# Patient Record
Sex: Male | Born: 1945 | Race: White | Hispanic: No
Health system: Southern US, Community
[De-identification: ages and names within clinical notes are randomized; demographics above are authoritative.]

---

## 2017-06-10 ENCOUNTER — Inpatient Hospital Stay
Admission: RE | Admit: 2017-06-10 | Discharge: 2017-06-28 | Disposition: A | Discharge status: Skilled Nursing Facility | Payer: Self-pay | Source: Other Acute Inpatient Hospital | Attending: Internal Medicine | Admitting: Internal Medicine

## 2017-06-10 DIAGNOSIS — Z992 Dependence on renal dialysis: Secondary | ICD-10-CM

## 2017-06-10 LAB — COMPREHENSIVE METABOLIC PANEL
ALBUMIN: 2.1 g/dL — AB (ref 3.5–5.0)
ALK PHOS: 1354 U/L — AB (ref 38–126)
ALT: 183 U/L — ABNORMAL HIGH (ref 17–63)
ANION GAP: 11 (ref 5–15)
AST: 273 U/L — ABNORMAL HIGH (ref 15–41)
BUN: 29 mg/dL — ABNORMAL HIGH (ref 6–20)
CALCIUM: 8.7 mg/dL — AB (ref 8.9–10.3)
CHLORIDE: 105 mmol/L (ref 101–111)
CO2: 24 mmol/L (ref 22–32)
Creatinine, Ser: 2.36 mg/dL — ABNORMAL HIGH (ref 0.61–1.24)
GFR calc non Af Amer: 26 mL/min — ABNORMAL LOW (ref >60)
GFR, EST AFRICAN AMERICAN: 30 mL/min — AB (ref >60)
GLUCOSE: 117 mg/dL — AB (ref 65–99)
POTASSIUM: 4 mmol/L (ref 3.5–5.1)
SODIUM: 140 mmol/L (ref 135–145)
Total Bilirubin: 24.1 mg/dL (ref 0.3–1.2)
Total Protein: 5.4 g/dL — ABNORMAL LOW (ref 6.5–8.1)

## 2017-06-10 LAB — CBC
HCT: 31 % — ABNORMAL LOW (ref 39.0–52.0)
Hemoglobin: 10.5 g/dL — ABNORMAL LOW (ref 13.0–17.0)
MCH: 28.5 pg (ref 26.0–34.0)
MCHC: 33.9 g/dL (ref 30.0–36.0)
MCV: 84 fL (ref 78.0–100.0)
PLATELETS: 278 10*3/uL (ref 150–400)
RBC: 3.69 MIL/uL — AB (ref 4.22–5.81)
RDW: 16.4 % — AB (ref 11.5–15.5)
WBC: 12.4 10*3/uL — ABNORMAL HIGH (ref 4.0–10.5)

## 2017-06-10 LAB — PROTIME-INR
INR: 1.4
Prothrombin Time: 17.1 seconds — ABNORMAL HIGH (ref 11.4–15.2)

## 2017-06-10 NOTE — Consult Note (Signed)
[facility] CONSULT NOTE    Date: 06/10/2017                  Patient Name:  Dakota Case  MRN: [id number]  DOB: 12/06/1945  Age / Sex: 71 y.o., male         PCP: Patient, No Pcp Per                 Service Requesting Consult: Hospitalist                 Reason for Consult: Evaluation and management of ESRD            History of Present Illness: Patient is a 71 y.o. male with a PMHx of obstructive sleep apnea, migraine headaches, coronary artery disease status post four-vessel CABG, hypertension, peripheral neuropathy, BPH, diabetes mellitus type 2, colon cancer, who was admitted to [facility] from 06/01/2017 to 06/10/2017. Patient was previously on peritoneal dialysis however during his recent hospitalization was transitioned to hemodialysis. He had a recent complex hospitalization that involved a non-ST elevation myocardial infarction, acute respiratory failure with hypoxia and progressive debility. Patient also had an acute right MCA CVA. Patient a bit confused now and unable to offeraccurate history. He last had hemodialysis yesterday. He appears to have a left IJ PermCath in place at the moment. His peritoneal dialysis catheter remains in place as well.  Medications: Humalg insulin, albuterol 3 ML's inhaled every 6 hours, amlodipine 5 mg daily, aspirin 81 mg daily, Lipitor 40 mg daily at bedtime, ciprofloxacin 500 mg daily, Plavix 75 mg daily, Pepcid 20 mg daily, folic acid 1 mg daily, heparin 5000 units subcutaneous every 8 hours, Imdur 30 mg by mouth every morning, metoprolol 25 mg twice a day, protein supplement, thiamine 100 mg daily, vitamin B12 1000 g daily   Allergies: No known drug allergies    Past Medical History: obstructive sleep apnea, migraine headaches, coronary artery disease status post four-vessel CABG, hypertension, peripheral neuropathy, BPH, diabetes mellitus type 2, colon cancer ESRD status post peritoneal dialysis catheter  placement 05/27/2017, history of partial colectomy  Past Surgical History: CABG PD catheter placement Partial colectomy Cholecystectomy ORIF humerus fracture  Family History: No family history of end-stage renal disease  Social History: No tobacco, alcohol, or illicit drug use.  Review of Systems: Patient unable to concentrate on ROS questions likely due to recent CVA  Vital Signs: Temperature 99.5 pulse 76 respirations 20 blood pressure 140/75  Physical Exam: General: NAD, laying in bed  Head: Normocephalic, atraumatic.  Eyes: Anicteric, EOMI  Nose: Mucous membranes moist, not inflammed, nonerythematous.  Throat: Oropharynx nonerythematous, no exudate appreciated.   Neck: Supple, trachea midline.  Lungs:  Normal respiratory effort. Clear to auscultation BL without crackles or wheezes.  Heart: RRR. S1 and S2 normal without gallop, murmur, or rubs.  Abdomen:  BS normoactive. Soft, Nondistended, non-tender.  No masses or organomegaly.  Extremities: trace pretibial edema.  Neurologic: Awake, alert, confused at times, will follow simple commands  Skin: No visible rashes, scars.    Lab results: Basic Metabolic Panel:  Recent Labs Lab 06/10/17 1350  NA 140  K 4.0  CL 105  CO2 24  GLUCOSE 117*  BUN 29*  CREATININE 2.36*  CALCIUM 8.7*    Liver Function Tests:  Recent Labs Lab 06/10/17 1350  AST 273*  ALT 183*  ALKPHOS 1,354*  BILITOT 24.1*  PROT 5.4*  ALBUMIN 2.1*   No results for input(s): LIPASE, AMYLASE in  the last 168 hours. No results for input(s): AMMONIA in the last 168 hours.  CBC:  Recent Labs Lab 06/10/17 1350  WBC 12.4*  HGB 10.5*  HCT 31.0*  MCV 84.0  PLT 278    Cardiac Enzymes: No results for input(s): CKTOTAL, CKMB, CKMBINDEX, TROPONINI in the last 168 hours.  BNP: Invalid input(s): POCBNP  CBG: No results for input(s): GLUCAP in the last 168 hours.  Microbiology: No results found for this or any previous  visit.  Coagulation Studies:  Recent Labs  06/10/17 1355  LABPROT 17.1*  INR 1.40    Urinalysis: No results for input(s): COLORURINE, LABSPEC, PHURINE, GLUCOSEU, HGBUR, BILIRUBINUR, KETONESUR, PROTEINUR, UROBILINOGEN, NITRITE, LEUKOCYTESUR in the last 72 hours.  Invalid input(s): APPERANCEUR    Imaging:  No results found.   Assessment & Plan: Pt is a 71 y.o. male with a PMHx of obstructive sleep apnea, migraine headaches, coronary artery disease status post four-vessel CABG, hypertension, peripheral neuropathy, BPH, diabetes mellitus type 2, colon cancer, ESRD on HD (previously on PD), anemia of CKD, SHPTH.    1. ESRD on HD. Patient was transitioned from peritoneal dialysis to hemodialysis recently. We will plan for hemodialysis again tomorrow. We will use his existing PermCath.  2. Anemia of chronic kidney disease. Hemoglobin currently 10.5. Hold off on Aranesp for now.  3. Secondary hyperparathyroidism. We will evaluate bone mineral metabolism parameters by checking PTH, phosphorus, and calcium tomorrow.  4. Hypertension. We will maintain the patient on metoprolol, amlodipine for now.

## 2017-06-11 ENCOUNTER — Other Ambulatory Visit (HOSPITAL_COMMUNITY): Payer: Self-pay

## 2017-06-11 LAB — RENAL FUNCTION PANEL
ANION GAP: 12 (ref 5–15)
Albumin: 2.1 g/dL — ABNORMAL LOW (ref 3.5–5.0)
BUN: 67 mg/dL — ABNORMAL HIGH (ref 6–20)
CALCIUM: 8.2 mg/dL — AB (ref 8.9–10.3)
CO2: 24 mmol/L (ref 22–32)
Chloride: 102 mmol/L (ref 101–111)
Creatinine, Ser: 6.28 mg/dL — ABNORMAL HIGH (ref 0.61–1.24)
GFR calc non Af Amer: 8 mL/min — ABNORMAL LOW (ref >60)
GFR, EST AFRICAN AMERICAN: 9 mL/min — AB (ref >60)
GLUCOSE: 85 mg/dL (ref 65–99)
POTASSIUM: 4.1 mmol/L (ref 3.5–5.1)
Phosphorus: 5.7 mg/dL — ABNORMAL HIGH (ref 2.5–4.6)
SODIUM: 138 mmol/L (ref 135–145)

## 2017-06-11 LAB — CK TOTAL AND CKMB (NOT AT ARMC)
CK, MB: 2.3 ng/mL (ref 0.5–5.0)
RELATIVE INDEX: INVALID (ref 0.0–2.5)
Total CK: 67 U/L (ref 49–397)

## 2017-06-11 LAB — BLOOD GAS, ARTERIAL
Acid-Base Excess: 0.6 mmol/L (ref 0.0–2.0)
Bicarbonate: 23.6 mmol/L (ref 20.0–28.0)
FIO2: 0.21
O2 SAT: 93.1 %
PATIENT TEMPERATURE: 98.6
PCO2 ART: 31 mmHg — AB (ref 32.0–48.0)
PO2 ART: 66.6 mmHg — AB (ref 83.0–108.0)
pH, Arterial: 7.495 — ABNORMAL HIGH (ref 7.350–7.450)

## 2017-06-11 LAB — COMPREHENSIVE METABOLIC PANEL
ALBUMIN: 2.2 g/dL — AB (ref 3.5–5.0)
ALT: 12 U/L — ABNORMAL LOW (ref 17–63)
ANION GAP: 13 (ref 5–15)
AST: 31 U/L (ref 15–41)
Alkaline Phosphatase: 75 U/L (ref 38–126)
BILIRUBIN TOTAL: 1.1 mg/dL (ref 0.3–1.2)
BUN: 32 mg/dL — AB (ref 6–20)
CO2: 21 mmol/L — AB (ref 22–32)
Calcium: 7.8 mg/dL — ABNORMAL LOW (ref 8.9–10.3)
Chloride: 98 mmol/L — ABNORMAL LOW (ref 101–111)
Creatinine, Ser: 3.72 mg/dL — ABNORMAL HIGH (ref 0.61–1.24)
GFR calc Af Amer: 18 mL/min — ABNORMAL LOW (ref >60)
GFR calc non Af Amer: 15 mL/min — ABNORMAL LOW (ref >60)
GLUCOSE: 105 mg/dL — AB (ref 65–99)
POTASSIUM: 3.5 mmol/L (ref 3.5–5.1)
SODIUM: 132 mmol/L — AB (ref 135–145)
Total Protein: 7 g/dL (ref 6.5–8.1)

## 2017-06-11 LAB — CBC
HCT: 36.1 % — ABNORMAL LOW (ref 39.0–52.0)
Hemoglobin: 11.4 g/dL — ABNORMAL LOW (ref 13.0–17.0)
MCH: 29.3 pg (ref 26.0–34.0)
MCHC: 31.6 g/dL (ref 30.0–36.0)
MCV: 92.8 fL (ref 78.0–100.0)
Platelets: 203 10*3/uL (ref 150–400)
RBC: 3.89 MIL/uL — AB (ref 4.22–5.81)
RDW: 13.2 % (ref 11.5–15.5)
WBC: 12.5 10*3/uL — AB (ref 4.0–10.5)

## 2017-06-11 LAB — TROPONIN I
TROPONIN I: 0.09 ng/mL — AB (ref <0.03)
TROPONIN I: 0.12 ng/mL — AB (ref <0.03)

## 2017-06-12 LAB — CBC WITH DIFFERENTIAL/PLATELET
Basophils Absolute: 0 10*3/uL (ref 0.0–0.1)
Basophils Relative: 0 %
EOS ABS: 0.4 10*3/uL (ref 0.0–0.7)
Eosinophils Relative: 4 %
HCT: 31.2 % — ABNORMAL LOW (ref 39.0–52.0)
HEMOGLOBIN: 9.5 g/dL — AB (ref 13.0–17.0)
LYMPHS ABS: 1.5 10*3/uL (ref 0.7–4.0)
Lymphocytes Relative: 15 %
MCH: 28.6 pg (ref 26.0–34.0)
MCHC: 30.4 g/dL (ref 30.0–36.0)
MCV: 94 fL (ref 78.0–100.0)
Monocytes Absolute: 0.6 10*3/uL (ref 0.1–1.0)
Monocytes Relative: 7 %
NEUTROS PCT: 74 %
Neutro Abs: 7.2 10*3/uL (ref 1.7–7.7)
Platelets: 176 10*3/uL (ref 150–400)
RBC: 3.32 MIL/uL — AB (ref 4.22–5.81)
RDW: 13.3 % (ref 11.5–15.5)
WBC: 9.8 10*3/uL (ref 4.0–10.5)

## 2017-06-12 LAB — HEPARIN LEVEL (UNFRACTIONATED): HEPARIN UNFRACTIONATED: 0.57 [IU]/mL (ref 0.30–0.70)

## 2017-06-12 LAB — PROTIME-INR
INR: 1.3
Prothrombin Time: 16.1 seconds — ABNORMAL HIGH (ref 11.4–15.2)

## 2017-06-12 LAB — APTT
APTT: 96 s — AB (ref 24–36)
aPTT: 33 seconds (ref 24–36)

## 2017-06-12 LAB — TROPONIN I: TROPONIN I: 0.07 ng/mL — AB (ref <0.03)

## 2017-06-12 LAB — PARATHYROID HORMONE, INTACT (NO CA): PTH: 66 pg/mL — AB (ref 15–65)

## 2017-06-13 LAB — HEPARIN LEVEL (UNFRACTIONATED)
HEPARIN UNFRACTIONATED: 0.83 [IU]/mL — AB (ref 0.30–0.70)
Heparin Unfractionated: 0.2 IU/mL — ABNORMAL LOW (ref 0.30–0.70)

## 2017-06-13 NOTE — Progress Notes (Signed)
  [facility]  ROUNDING NOTE   Subjective:  Treatment was ended early on Saturday. Patient had change in mental status at that time.   Objective:  Vital signs in last 24 hours:  Temperature 98.4 pulse 96 respirations 20 blood pressure 128/69 Physical Exam: General: No acute distress  Head: Normocephalic, atraumatic. Moist oral mucosal membranes  Eyes: Anicteric  Neck: Supple, trachea midline  Lungs:  Clear to auscultation, normal effort  Heart: S1S2 no rubs  Abdomen:  Soft, nontender, bowel sounds present  Extremities: Trace peripheral edema.  Neurologic: Awake, confused  Skin: No lesions  Access: permcath in place    Basic Metabolic Panel:  Recent Labs Lab 06/10/17 1350 06/11/17 0928 06/11/17 1100  NA 140 138 132*  K 4.0 4.1 3.5  CL 105 102 98*  CO2 24 24 21*  GLUCOSE 117* 85 105*  BUN 29* 67* 32*  CREATININE 2.36* 6.28* 3.72*  CALCIUM 8.7* 8.2* 7.8*  PHOS  --  5.7*  --     Liver Function Tests:  Recent Labs Lab 06/10/17 1350 06/11/17 0928 06/11/17 1100  AST 273*  --  31  ALT 183*  --  12*  ALKPHOS 1,354*  --  75  BILITOT 24.1*  --  1.1  PROT 5.4*  --  7.0  ALBUMIN 2.1* 2.1* 2.2*   No results for input(s): LIPASE, AMYLASE in the last 168 hours. No results for input(s): AMMONIA in the last 168 hours.  CBC:  Recent Labs Lab 06/10/17 1350 06/11/17 1050 06/12/17 0529  WBC 12.4* 12.5* 9.8  NEUTROABS  --   --  7.2  HGB 10.5* 11.4* 9.5*  HCT 31.0* 36.1* 31.2*  MCV 84.0 92.8 94.0  PLT 278 203 176    Cardiac Enzymes:  Recent Labs Lab 06/11/17 1100 06/11/17 1642 06/12/17 0529  CKTOTAL 67  --   --   CKMB 2.3  --   --   TROPONINI 0.09* 0.12* 0.07*    BNP: Invalid input(s): POCBNP  CBG: No results for input(s): GLUCAP in the last 168 hours.  Microbiology: No results found for this or any previous visit.  Coagulation Studies:  Recent Labs  06/12/17 0203  LABPROT 16.1*  INR 1.30    Urinalysis: No results for  input(s): COLORURINE, LABSPEC, PHURINE, GLUCOSEU, HGBUR, BILIRUBINUR, KETONESUR, PROTEINUR, UROBILINOGEN, NITRITE, LEUKOCYTESUR in the last 72 hours.  Invalid input(s): APPERANCEUR    Imaging: No results found.   Medications:       Assessment/ Plan:  71 y.o. male with a PMHx of obstructive sleep apnea, migraine headaches, coronary artery disease status post four-vessel CABG, hypertension, peripheral neuropathy, BPH, diabetes mellitus type 2, colon cancer, ESRD on HD (previously on PD), anemia of CKD, SHPTH.    1. ESRD on HD. Patient was transitioned from peritoneal dialysis to hemodialysis recently.  - Patient had change in mental status during dialysis on Saturday. We will plan for dialysis again tomorrow and keep a very low ultrafiltration target of 1 kg.  2. Anemia of chronic kidney disease. Hemoglobin currently 9.5. Hold off on Aranesp for now.  3. Secondary hyperparathyroidism. Phosphorus currently 5.7. Recheck serum phosphorus tomorrow.  4. Hypertension.  Continue amlodipine and metoprolol.   LOS: 0 Deundre Thong 10/15/20183:56 PM

## 2017-06-14 LAB — RENAL FUNCTION PANEL
ANION GAP: 13 (ref 5–15)
Albumin: 2.1 g/dL — ABNORMAL LOW (ref 3.5–5.0)
BUN: 68 mg/dL — ABNORMAL HIGH (ref 6–20)
CHLORIDE: 106 mmol/L (ref 101–111)
CO2: 20 mmol/L — AB (ref 22–32)
Calcium: 8.3 mg/dL — ABNORMAL LOW (ref 8.9–10.3)
Creatinine, Ser: 7.42 mg/dL — ABNORMAL HIGH (ref 0.61–1.24)
GFR calc non Af Amer: 7 mL/min — ABNORMAL LOW (ref >60)
GFR, EST AFRICAN AMERICAN: 8 mL/min — AB (ref >60)
Glucose, Bld: 95 mg/dL (ref 65–99)
POTASSIUM: 4.1 mmol/L (ref 3.5–5.1)
Phosphorus: 7.4 mg/dL — ABNORMAL HIGH (ref 2.5–4.6)
Sodium: 139 mmol/L (ref 135–145)

## 2017-06-14 LAB — CBC WITH DIFFERENTIAL/PLATELET
Basophils Absolute: 0 10*3/uL (ref 0.0–0.1)
Basophils Relative: 0 %
EOS PCT: 7 %
Eosinophils Absolute: 0.7 10*3/uL (ref 0.0–0.7)
HCT: 31.6 % — ABNORMAL LOW (ref 39.0–52.0)
HEMOGLOBIN: 9.9 g/dL — AB (ref 13.0–17.0)
LYMPHS ABS: 1.4 10*3/uL (ref 0.7–4.0)
LYMPHS PCT: 14 %
MCH: 28.8 pg (ref 26.0–34.0)
MCHC: 31.3 g/dL (ref 30.0–36.0)
MCV: 91.9 fL (ref 78.0–100.0)
MONOS PCT: 9 %
Monocytes Absolute: 0.9 10*3/uL (ref 0.1–1.0)
NEUTROS PCT: 70 %
Neutro Abs: 6.8 10*3/uL (ref 1.7–7.7)
Platelets: 235 10*3/uL (ref 150–400)
RBC: 3.44 MIL/uL — AB (ref 4.22–5.81)
RDW: 12.9 % (ref 11.5–15.5)
WBC: 9.7 10*3/uL (ref 4.0–10.5)

## 2017-06-14 LAB — HEPATITIS B CORE ANTIBODY, IGM: HEP B C IGM: NEGATIVE

## 2017-06-14 LAB — HEPATITIS B SURFACE ANTIGEN: Hepatitis B Surface Ag: NEGATIVE

## 2017-06-14 LAB — HEPATITIS B SURFACE ANTIBODY, QUANTITATIVE: Hepatitis B-Post: 52.3 m[IU]/mL (ref >9.9)

## 2017-06-15 NOTE — Progress Notes (Signed)
[facility]  ROUNDING NOTE   Subjective:  Patient continues dialysis on a Tuesday, Thursday, and Saturday schedule. Next line he had hemodialysis yesterday. He did become hypotensive a bit. We subsequently ordered albumin during dialysis.   Objective:  Vital signs in last 24 hours:  Temperature 97.9 pulse 85 respirations 16 blood pressure 121/63  Physical Exam: General: No acute distress  Head: Normocephalic, atraumatic. Moist oral mucosal membranes  Eyes: Anicteric  Neck: Supple, trachea midline  Lungs:  Clear to auscultation, normal effort  Heart: S1S2 no rubs  Abdomen:  Soft, nontender, bowel sounds present  Extremities: Trace peripheral edema.  Neurologic: Awake, confused  Skin: No lesions  Access: permcath in place    Basic Metabolic Panel:  Recent Labs Lab 06/10/17 1350 06/11/17 0928 06/11/17 1100 06/14/17 0542  NA 140 138 132* 139  K 4.0 4.1 3.5 4.1  CL 105 102 98* 106  CO2 24 24 21* 20*  GLUCOSE 117* 85 105* 95  BUN 29* 67* 32* 68*  CREATININE 2.36* 6.28* 3.72* 7.42*  CALCIUM 8.7* 8.2* 7.8* 8.3*  PHOS  --  5.7*  --  7.4*    Liver Function Tests:  Recent Labs Lab 06/10/17 1350 06/11/17 0928 06/11/17 1100 06/14/17 0542  AST 273*  --  31  --   ALT 183*  --  12*  --   ALKPHOS 1,354*  --  75  --   BILITOT 24.1*  --  1.1  --   PROT 5.4*  --  7.0  --   ALBUMIN 2.1* 2.1* 2.2* 2.1*   No results for input(s): LIPASE, AMYLASE in the last 168 hours. No results for input(s): AMMONIA in the last 168 hours.  CBC:  Recent Labs Lab 06/10/17 1350 06/11/17 1050 06/12/17 0529 06/14/17 0542  WBC 12.4* 12.5* 9.8 9.7  NEUTROABS  --   --  7.2 6.8  HGB 10.5* 11.4* 9.5* 9.9*  HCT 31.0* 36.1* 31.2* 31.6*  MCV 84.0 92.8 94.0 91.9  PLT 278 203 176 235    Cardiac Enzymes:  Recent Labs Lab 06/11/17 1100 06/11/17 1642 06/12/17 0529  CKTOTAL 67  --   --   CKMB 2.3  --   --   TROPONINI 0.09* 0.12* 0.07*    BNP: Invalid input(s):  POCBNP  CBG: No results for input(s): GLUCAP in the last 168 hours.  Microbiology: No results found for this or any previous visit.  Coagulation Studies: No results for input(s): LABPROT, INR in the last 72 hours.  Urinalysis: No results for input(s): COLORURINE, LABSPEC, PHURINE, GLUCOSEU, HGBUR, BILIRUBINUR, KETONESUR, PROTEINUR, UROBILINOGEN, NITRITE, LEUKOCYTESUR in the last 72 hours.  Invalid input(s): APPERANCEUR    Imaging: No results found.   Medications:       Assessment/ Plan:  71 y.o. male with a PMHx of obstructive sleep apnea, migraine headaches, coronary artery disease status post four-vessel CABG, hypertension, peripheral neuropathy, BPH, diabetes mellitus type 2, colon cancer, ESRD on HD (previously on PD), anemia of CKD, SHPTH.    1. ESRD on HD. Patient was transitioned from peritoneal dialysis to hemodialysis recently.  - Patient had hemodialysis yesterday. We will plan for dialysis again tomorrow.  2. Anemia of chronic kidney disease. Hemoglobin improved to 9.9. Hold off on Aranesp for now.  3. Secondary hyperparathyroidism. Serum phosphorus was high on Monday at 7.4. We will plan to repeat serum phosphorus tomorrow. If still high we will consider adding a binder.  4. Hypertension.  Continue amlodipine and metoprolol.   LOS:  0 Ikea Demicco 10/17/20183:25 PM

## 2017-06-16 LAB — CBC WITH DIFFERENTIAL/PLATELET
BASOS ABS: 0 10*3/uL (ref 0.0–0.1)
Basophils Relative: 1 %
EOS PCT: 6 %
Eosinophils Absolute: 0.5 10*3/uL (ref 0.0–0.7)
HCT: 29.2 % — ABNORMAL LOW (ref 39.0–52.0)
Hemoglobin: 9 g/dL — ABNORMAL LOW (ref 13.0–17.0)
LYMPHS PCT: 16 %
Lymphs Abs: 1.4 10*3/uL (ref 0.7–4.0)
MCH: 28.6 pg (ref 26.0–34.0)
MCHC: 30.8 g/dL (ref 30.0–36.0)
MCV: 92.7 fL (ref 78.0–100.0)
Monocytes Absolute: 0.6 10*3/uL (ref 0.1–1.0)
Monocytes Relative: 6 %
NEUTROS ABS: 6.4 10*3/uL (ref 1.7–7.7)
Neutrophils Relative %: 71 %
PLATELETS: 220 10*3/uL (ref 150–400)
RBC: 3.15 MIL/uL — AB (ref 4.22–5.81)
RDW: 13 % (ref 11.5–15.5)
WBC: 8.9 10*3/uL (ref 4.0–10.5)

## 2017-06-16 LAB — RENAL FUNCTION PANEL
ALBUMIN: 2.4 g/dL — AB (ref 3.5–5.0)
Anion gap: 11 (ref 5–15)
BUN: 50 mg/dL — ABNORMAL HIGH (ref 6–20)
CALCIUM: 8.1 mg/dL — AB (ref 8.9–10.3)
CHLORIDE: 103 mmol/L (ref 101–111)
CO2: 22 mmol/L (ref 22–32)
CREATININE: 6 mg/dL — AB (ref 0.61–1.24)
GFR, EST AFRICAN AMERICAN: 10 mL/min — AB (ref >60)
GFR, EST NON AFRICAN AMERICAN: 9 mL/min — AB (ref >60)
Glucose, Bld: 134 mg/dL — ABNORMAL HIGH (ref 65–99)
PHOSPHORUS: 5.6 mg/dL — AB (ref 2.5–4.6)
Potassium: 3.7 mmol/L (ref 3.5–5.1)
Sodium: 136 mmol/L (ref 135–145)

## 2017-06-17 LAB — PTH, INTACT AND CALCIUM
Calcium, Total (PTH): 8 mg/dL — ABNORMAL LOW (ref 8.6–10.2)
PTH: 159 pg/mL — ABNORMAL HIGH (ref 15–65)

## 2017-06-17 NOTE — Progress Notes (Signed)
[facility]  ROUNDING NOTE   Subjective:  Patient continues dialysis on a Tuesday, Thursday, Saturday schedule. He appears to be a bit more interactive today. However still is confused.   Objective:  Vital signs in last 24 hours:  Temperature 97.9 pulse 85 respirations 16 blood pressure 121/63  Physical Exam: General: No acute distress  Head: Normocephalic, atraumatic. Moist oral mucosal membranes  Eyes: Anicteric  Neck: Supple, trachea midline  Lungs:  Clear to auscultation, normal effort  Heart: S1S2 no rubs  Abdomen:  Soft, nontender, bowel sounds present  Extremities: Trace peripheral edema.  Neurologic: Awake, confused, follows simple commands  Skin: No lesions  Access: L IJ permcath in place    Basic Metabolic Panel:  Recent Labs Lab 06/10/17 1350 06/11/17 0928 06/11/17 1100 06/14/17 0542 06/16/17 0631  NA 140 138 132* 139 136  K 4.0 4.1 3.5 4.1 3.7  CL 105 102 98* 106 103  CO2 24 24 21* 20* 22  GLUCOSE 117* 85 105* 95 134*  BUN 29* 67* 32* 68* 50*  CREATININE 2.36* 6.28* 3.72* 7.42* 6.00*  CALCIUM 8.7* 8.2* 7.8* 8.3* 8.1*  PHOS  --  5.7*  --  7.4* 5.6*    Liver Function Tests:  Recent Labs Lab 06/10/17 1350 06/11/17 0928 06/11/17 1100 06/14/17 0542 06/16/17 0631  AST 273*  --  31  --   --   ALT 183*  --  12*  --   --   ALKPHOS 1,354*  --  75  --   --   BILITOT 24.1*  --  1.1  --   --   PROT 5.4*  --  7.0  --   --   ALBUMIN 2.1* 2.1* 2.2* 2.1* 2.4*   No results for input(s): LIPASE, AMYLASE in the last 168 hours. No results for input(s): AMMONIA in the last 168 hours.  CBC:  Recent Labs Lab 06/10/17 1350 06/11/17 1050 06/12/17 0529 06/14/17 0542 06/16/17 0631  WBC 12.4* 12.5* 9.8 9.7 8.9  NEUTROABS  --   --  7.2 6.8 6.4  HGB 10.5* 11.4* 9.5* 9.9* 9.0*  HCT 31.0* 36.1* 31.2* 31.6* 29.2*  MCV 84.0 92.8 94.0 91.9 92.7  PLT 278 203 176 235 220    Cardiac Enzymes:  Recent Labs Lab 06/11/17 1100 06/11/17 1642  06/12/17 0529  CKTOTAL 67  --   --   CKMB 2.3  --   --   TROPONINI 0.09* 0.12* 0.07*    BNP: Invalid input(s): POCBNP  CBG: No results for input(s): GLUCAP in the last 168 hours.  Microbiology: No results found for this or any previous visit.  Coagulation Studies: No results for input(s): LABPROT, INR in the last 72 hours.  Urinalysis: No results for input(s): COLORURINE, LABSPEC, PHURINE, GLUCOSEU, HGBUR, BILIRUBINUR, KETONESUR, PROTEINUR, UROBILINOGEN, NITRITE, LEUKOCYTESUR in the last 72 hours.  Invalid input(s): APPERANCEUR    Imaging: No results found.   Medications:       Assessment/ Plan:  71 y.o. male with a PMHx of obstructive sleep apnea, migraine headaches, coronary artery disease status post four-vessel CABG, hypertension, peripheral neuropathy, BPH, diabetes mellitus type 2, colon cancer, ESRD on HD (previously on PD), anemia of CKD, SHPTH.    1. ESRD on HD. Patient was transitioned from peritoneal dialysis to hemodialysis recently.  - Patient completed hemodialysis yesterday. We will plan for dialysis again on Saturday.  2. Anemia of chronic kidney disease. Hemoglobin slightly down to 9.0. Follow-up CBC tomorrow.  3. Secondary hyperparathyroidism. Renvela  has been added to his medication regimen. Follow-up phosphorus on Saturday.  4. Hypertension.  Continue amlodipine and metoprolol, blood pressure currently 150/80.   LOS: 0 Kaylie Ritter 10/19/20188:16 AM

## 2017-06-18 LAB — RENAL FUNCTION PANEL
ALBUMIN: 2.4 g/dL — AB (ref 3.5–5.0)
ANION GAP: 11 (ref 5–15)
BUN: 29 mg/dL — ABNORMAL HIGH (ref 6–20)
CALCIUM: 8.2 mg/dL — AB (ref 8.9–10.3)
CO2: 22 mmol/L (ref 22–32)
CREATININE: 4.96 mg/dL — AB (ref 0.61–1.24)
Chloride: 103 mmol/L (ref 101–111)
GFR, EST AFRICAN AMERICAN: 12 mL/min — AB (ref >60)
GFR, EST NON AFRICAN AMERICAN: 11 mL/min — AB (ref >60)
Glucose, Bld: 107 mg/dL — ABNORMAL HIGH (ref 65–99)
PHOSPHORUS: 4.5 mg/dL (ref 2.5–4.6)
Potassium: 3.7 mmol/L (ref 3.5–5.1)
SODIUM: 136 mmol/L (ref 135–145)

## 2017-06-18 LAB — CBC WITH DIFFERENTIAL/PLATELET
BASOS ABS: 0.1 10*3/uL (ref 0.0–0.1)
BASOS PCT: 1 %
EOS ABS: 0.5 10*3/uL (ref 0.0–0.7)
EOS PCT: 7 %
HCT: 31.5 % — ABNORMAL LOW (ref 39.0–52.0)
HEMOGLOBIN: 9.8 g/dL — AB (ref 13.0–17.0)
Lymphocytes Relative: 15 %
Lymphs Abs: 1.1 10*3/uL (ref 0.7–4.0)
MCH: 28.6 pg (ref 26.0–34.0)
MCHC: 31.1 g/dL (ref 30.0–36.0)
MCV: 91.8 fL (ref 78.0–100.0)
Monocytes Absolute: 0.8 10*3/uL (ref 0.1–1.0)
Monocytes Relative: 10 %
Neutro Abs: 5 10*3/uL (ref 1.7–7.7)
Neutrophils Relative %: 67 %
PLATELETS: 228 10*3/uL (ref 150–400)
RBC: 3.43 MIL/uL — AB (ref 4.22–5.81)
RDW: 12.9 % (ref 11.5–15.5)
WBC: 7.4 10*3/uL (ref 4.0–10.5)

## 2017-06-20 LAB — CBC WITH DIFFERENTIAL/PLATELET
BASOS PCT: 1 %
Basophils Absolute: 0.1 10*3/uL (ref 0.0–0.1)
EOS ABS: 0.6 10*3/uL (ref 0.0–0.7)
EOS PCT: 7 %
HCT: 31.9 % — ABNORMAL LOW (ref 39.0–52.0)
Hemoglobin: 9.8 g/dL — ABNORMAL LOW (ref 13.0–17.0)
LYMPHS ABS: 1.5 10*3/uL (ref 0.7–4.0)
Lymphocytes Relative: 18 %
MCH: 28.6 pg (ref 26.0–34.0)
MCHC: 30.7 g/dL (ref 30.0–36.0)
MCV: 93 fL (ref 78.0–100.0)
MONO ABS: 0.6 10*3/uL (ref 0.1–1.0)
MONOS PCT: 7 %
NEUTROS PCT: 67 %
Neutro Abs: 5.6 10*3/uL (ref 1.7–7.7)
PLATELETS: 261 10*3/uL (ref 150–400)
RBC: 3.43 MIL/uL — ABNORMAL LOW (ref 4.22–5.81)
RDW: 13.1 % (ref 11.5–15.5)
WBC: 8.5 10*3/uL (ref 4.0–10.5)

## 2017-06-20 LAB — RENAL FUNCTION PANEL
Albumin: 2.4 g/dL — ABNORMAL LOW (ref 3.5–5.0)
Anion gap: 6 (ref 5–15)
BUN: 45 mg/dL — AB (ref 6–20)
CALCIUM: 8.3 mg/dL — AB (ref 8.9–10.3)
CHLORIDE: 102 mmol/L (ref 101–111)
CO2: 26 mmol/L (ref 22–32)
CREATININE: 7 mg/dL — AB (ref 0.61–1.24)
GFR, EST AFRICAN AMERICAN: 8 mL/min — AB (ref >60)
GFR, EST NON AFRICAN AMERICAN: 7 mL/min — AB (ref >60)
Glucose, Bld: 120 mg/dL — ABNORMAL HIGH (ref 65–99)
Phosphorus: 5.1 mg/dL — ABNORMAL HIGH (ref 2.5–4.6)
Potassium: 3.9 mmol/L (ref 3.5–5.1)
SODIUM: 134 mmol/L — AB (ref 135–145)

## 2017-06-20 LAB — CBC
HCT: 30.2 % — ABNORMAL LOW (ref 39.0–52.0)
Hemoglobin: 9.4 g/dL — ABNORMAL LOW (ref 13.0–17.0)
MCH: 29 pg (ref 26.0–34.0)
MCHC: 31.1 g/dL (ref 30.0–36.0)
MCV: 93.2 fL (ref 78.0–100.0)
PLATELETS: 295 10*3/uL (ref 150–400)
RBC: 3.24 MIL/uL — AB (ref 4.22–5.81)
RDW: 13 % (ref 11.5–15.5)
WBC: 7.8 10*3/uL (ref 4.0–10.5)

## 2017-06-20 LAB — TROPONIN I

## 2017-06-20 NOTE — Progress Notes (Signed)
Approximately 40 minutes into treatment, bedside RN Bimpe came to patient room 5E-05 and informed this RN that patient had an MI while running HD.  This information was not communicated during pre treatment report, and Bimpe unable to give details of episode.  She offered patients chart to RN for more information.  This RN able to read notes from HD on 10/13 to ascertain symptoms patient exhibited during that treatment.  Patient is currently complaining of being dizzy.  UF turned off and patient reclined in chair.  This RN attempted to contact nephrology, unsuccessfully.  Will continue to monitor.

## 2017-06-20 NOTE — Progress Notes (Signed)
Treatment terminated after 1.5 hours d/t shortness of breath and chest tightness/pain per patient.  Bedside RN and Charge RN refusing to page physician to bedside for assessment.  At time of take off, this RN unable to contact nephrology.  Post tx, this RN reached nephrology and reported patient symptoms and interventions.  Nephrology to assess patient later today.

## 2017-06-21 LAB — RENAL FUNCTION PANEL
Albumin: 2.4 g/dL — ABNORMAL LOW (ref 3.5–5.0)
Anion gap: 13 (ref 5–15)
BUN: 51 mg/dL — AB (ref 6–20)
CHLORIDE: 102 mmol/L (ref 101–111)
CO2: 24 mmol/L (ref 22–32)
CREATININE: 8.23 mg/dL — AB (ref 0.61–1.24)
Calcium: 8.3 mg/dL — ABNORMAL LOW (ref 8.9–10.3)
GFR calc Af Amer: 7 mL/min — ABNORMAL LOW (ref >60)
GFR, EST NON AFRICAN AMERICAN: 6 mL/min — AB (ref >60)
GLUCOSE: 143 mg/dL — AB (ref 65–99)
Phosphorus: 6.2 mg/dL — ABNORMAL HIGH (ref 2.5–4.6)
Potassium: 4.3 mmol/L (ref 3.5–5.1)
Sodium: 139 mmol/L (ref 135–145)

## 2017-06-21 LAB — CBC
HCT: 29.6 % — ABNORMAL LOW (ref 39.0–52.0)
Hemoglobin: 9 g/dL — ABNORMAL LOW (ref 13.0–17.0)
MCH: 28.8 pg (ref 26.0–34.0)
MCHC: 30.4 g/dL (ref 30.0–36.0)
MCV: 94.6 fL (ref 78.0–100.0)
PLATELETS: 259 10*3/uL (ref 150–400)
RBC: 3.13 MIL/uL — ABNORMAL LOW (ref 4.22–5.81)
RDW: 13.2 % (ref 11.5–15.5)
WBC: 7.6 10*3/uL (ref 4.0–10.5)

## 2017-06-21 NOTE — Progress Notes (Signed)
  [facility]  ROUNDING NOTE   Subjective:  Patient due for dialysis later today. We are considering switching him to dialysis on Monday, Wednesday, Friday. Hadd some shortness of breath at last dialysis treatment. Currently breathing comfortably.  Objective:  Vital signs in last 24 hours:  Temperature 97.8 pulse 67 respirations 20 blood pressure 94/55  Physical Exam: General: No acute distress  Head: Normocephalic, atraumatic. Moist oral mucosal membranes  Eyes: Anicteric  Neck: Supple, trachea midline  Lungs:  Clear to auscultation, normal effort  Heart: S1S2 no rubs  Abdomen:  Soft, nontender, bowel sounds present  Extremities: Trace peripheral edema.  Neurologic: Awake, confused, follows simple commands  Skin: No lesions  Access: L IJ permcath in place    Basic Metabolic Panel:  Recent Labs Lab 06/16/17 0631 06/18/17 0517 06/19/17 0016  NA 136 136 134*  K 3.7 3.7 3.9  CL 103 103 102  CO2 22 22 26   GLUCOSE 134* 107* 120*  BUN 50* 29* 45*  CREATININE 6.00* 4.96* 7.00*  CALCIUM 8.1*  8.0* 8.2* 8.3*  PHOS 5.6* 4.5 5.1*    Liver Function Tests:  Recent Labs Lab 06/16/17 0631 06/18/17 0517 06/19/17 0016  ALBUMIN 2.4* 2.4* 2.4*   No results for input(s): LIPASE, AMYLASE in the last 168 hours. No results for input(s): AMMONIA in the last 168 hours.  CBC:  Recent Labs Lab 06/16/17 0631 06/18/17 0517 06/19/17 0016 06/20/17 0721  WBC 8.9 7.4 7.8 8.5  NEUTROABS 6.4 5.0  --  5.6  HGB 9.0* 9.8* 9.4* 9.8*  HCT 29.2* 31.5* 30.2* 31.9*  MCV 92.7 91.8 93.2 93.0  PLT 220 228 295 261    Cardiac Enzymes:  Recent Labs Lab 06/20/17 0125  TROPONINI <0.03    BNP: Invalid input(s): POCBNP  CBG: No results for input(s): GLUCAP in the last 168 hours.  Microbiology: No results found for this or any previous visit.  Coagulation Studies: No results for input(s): LABPROT, INR in the last 72 hours.  Urinalysis: No results for input(s):  COLORURINE, LABSPEC, PHURINE, GLUCOSEU, HGBUR, BILIRUBINUR, KETONESUR, PROTEINUR, UROBILINOGEN, NITRITE, LEUKOCYTESUR in the last 72 hours.  Invalid input(s): APPERANCEUR    Imaging: No results found.   Medications:       Assessment/ Plan:  71 y.o. male with a PMHx of obstructive sleep apnea, migraine headaches, coronary artery disease status post four-vessel CABG, hypertension, peripheral neuropathy, BPH, diabetes mellitus type 2, colon cancer, ESRD on HD (previously on PD), anemia of CKD, SHPTH.    1. ESRD on HD. Patient was transitioned from peritoneal dialysis to hemodialysis recently.  - we will plan for dialysis later today. We will then switch the patient to dialysis on Monday, Wednesday, Friday.  2. Anemia of chronic kidney disease. Hemoglobin improved to 9.8. Continue to monitor CBC.  3. Secondary hyperparathyroidism. Phosphorous a target of 5.1. Continue Renvela.  4. Hypertension.  Continue amlodipine and metoprolol.   LOS: 0 Shacoya Burkhammer 10/23/20183:23 PM

## 2017-06-22 LAB — CBC WITH DIFFERENTIAL/PLATELET
BASOS ABS: 0.1 10*3/uL (ref 0.0–0.1)
Basophils Relative: 1 %
EOS ABS: 0.7 10*3/uL (ref 0.0–0.7)
Eosinophils Relative: 9 %
HCT: 29.7 % — ABNORMAL LOW (ref 39.0–52.0)
HEMOGLOBIN: 8.9 g/dL — AB (ref 13.0–17.0)
LYMPHS ABS: 2 10*3/uL (ref 0.7–4.0)
LYMPHS PCT: 26 %
MCH: 28.3 pg (ref 26.0–34.0)
MCHC: 30 g/dL (ref 30.0–36.0)
MCV: 94.3 fL (ref 78.0–100.0)
Monocytes Absolute: 0.6 10*3/uL (ref 0.1–1.0)
Monocytes Relative: 8 %
NEUTROS PCT: 56 %
Neutro Abs: 4.3 10*3/uL (ref 1.7–7.7)
Platelets: 249 10*3/uL (ref 150–400)
RBC: 3.15 MIL/uL — AB (ref 4.22–5.81)
RDW: 13.2 % (ref 11.5–15.5)
WBC: 7.6 10*3/uL (ref 4.0–10.5)

## 2017-06-22 LAB — RENAL FUNCTION PANEL
ANION GAP: 15 (ref 5–15)
Albumin: 2.4 g/dL — ABNORMAL LOW (ref 3.5–5.0)
BUN: 61 mg/dL — ABNORMAL HIGH (ref 6–20)
CHLORIDE: 102 mmol/L (ref 101–111)
CO2: 23 mmol/L (ref 22–32)
Calcium: 8.4 mg/dL — ABNORMAL LOW (ref 8.9–10.3)
Creatinine, Ser: 8.77 mg/dL — ABNORMAL HIGH (ref 0.61–1.24)
GFR, EST AFRICAN AMERICAN: 6 mL/min — AB (ref >60)
GFR, EST NON AFRICAN AMERICAN: 5 mL/min — AB (ref >60)
Glucose, Bld: 119 mg/dL — ABNORMAL HIGH (ref 65–99)
POTASSIUM: 4.3 mmol/L (ref 3.5–5.1)
Phosphorus: 7 mg/dL — ABNORMAL HIGH (ref 2.5–4.6)
Sodium: 140 mmol/L (ref 135–145)

## 2017-06-22 NOTE — Progress Notes (Signed)
  [facility]  ROUNDING NOTE   Subjective:  Patient completed hemodialysis today. Appears to be in good spirits at the moment.   Objective:  Vital signs in last 24 hours:  Temperature 98.6 pulse 68 respirations 20 blood pressure 121/61  Physical Exam: General: No acute distress  Head: Normocephalic, atraumatic. Moist oral mucosal membranes  Eyes: Anicteric  Neck: Supple, trachea midline  Lungs:  Clear to auscultation, normal effort  Heart: S1S2 no rubs  Abdomen:  Soft, nontender, bowel sounds present  Extremities: Trace peripheral edema.  Neurologic: Awake, confused, follows simple commands  Skin: No lesions  Access: L IJ permcath in place    Basic Metabolic Panel:  Recent Labs Lab 06/16/17 0631 06/18/17 0517 06/19/17 0016 06/21/17 1843 06/22/17 0603  NA 136 136 134* 139 140  K 3.7 3.7 3.9 4.3 4.3  CL 103 103 102 102 102  CO2 22 22 26 24 23   GLUCOSE 134* 107* 120* 143* 119*  BUN 50* 29* 45* 51* 61*  CREATININE 6.00* 4.96* 7.00* 8.23* 8.77*  CALCIUM 8.1*  8.0* 8.2* 8.3* 8.3* 8.4*  PHOS 5.6* 4.5 5.1* 6.2* 7.0*    Liver Function Tests:  Recent Labs Lab 06/16/17 0631 06/18/17 0517 06/19/17 0016 06/21/17 1843 06/22/17 0603  ALBUMIN 2.4* 2.4* 2.4* 2.4* 2.4*   No results for input(s): LIPASE, AMYLASE in the last 168 hours. No results for input(s): AMMONIA in the last 168 hours.  CBC:  Recent Labs Lab 06/16/17 0631 06/18/17 0517 06/19/17 0016 06/20/17 0721 06/21/17 1843 06/22/17 0603  WBC 8.9 7.4 7.8 8.5 7.6 7.6  NEUTROABS 6.4 5.0  --  5.6  --  4.3  HGB 9.0* 9.8* 9.4* 9.8* 9.0* 8.9*  HCT 29.2* 31.5* 30.2* 31.9* 29.6* 29.7*  MCV 92.7 91.8 93.2 93.0 94.6 94.3  PLT 220 228 295 261 259 249    Cardiac Enzymes:  Recent Labs Lab 06/20/17 0125  TROPONINI <0.03    BNP: Invalid input(s): POCBNP  CBG: No results for input(s): GLUCAP in the last 168 hours.  Microbiology: No results found for this or any previous  visit.  Coagulation Studies: No results for input(s): LABPROT, INR in the last 72 hours.  Urinalysis: No results for input(s): COLORURINE, LABSPEC, PHURINE, GLUCOSEU, HGBUR, BILIRUBINUR, KETONESUR, PROTEINUR, UROBILINOGEN, NITRITE, LEUKOCYTESUR in the last 72 hours.  Invalid input(s): APPERANCEUR    Imaging: No results found.   Medications:       Assessment/ Plan:  71 y.o. male with a PMHx of obstructive sleep apnea, migraine headaches, coronary artery disease status post four-vessel CABG, hypertension, peripheral neuropathy, BPH, diabetes mellitus type 2, colon cancer, ESRD on HD (previously on PD), anemia of CKD, SHPTH.    1. ESRD on HD. Patient was transitioned from peritoneal dialysis to hemodialysis recently.  -patient completed hemodialysis today. He tolerated this well. We will prepare dialysis orders for Friday.  2. Anemia of chronic kidney disease. Hemoglobin down a bit to 8.9.  We plan to start the patient on Aranesp 60 g subcutaneous weekly.  3. Secondary hyperparathyroidism. Phosphorus rising again and up to 7.0 despite use of Renvela. Recheck phosphorus on Friday.  4. Hypertension.  Blood pressure acceptable at the moment. Continue amlodipine and metoprolol.   LOS: 0 Deshana Rominger 10/24/20183:09 PM

## 2017-06-24 ENCOUNTER — Other Ambulatory Visit (HOSPITAL_COMMUNITY): Payer: Self-pay

## 2017-06-24 LAB — CBC WITH DIFFERENTIAL/PLATELET
Basophils Absolute: 0 10*3/uL (ref 0.0–0.1)
Basophils Relative: 1 %
EOS PCT: 9 %
Eosinophils Absolute: 0.7 10*3/uL (ref 0.0–0.7)
HCT: 28.7 % — ABNORMAL LOW (ref 39.0–52.0)
HEMOGLOBIN: 8.7 g/dL — AB (ref 13.0–17.0)
LYMPHS PCT: 24 %
Lymphs Abs: 1.8 10*3/uL (ref 0.7–4.0)
MCH: 28.3 pg (ref 26.0–34.0)
MCHC: 30.3 g/dL (ref 30.0–36.0)
MCV: 93.5 fL (ref 78.0–100.0)
Monocytes Absolute: 0.6 10*3/uL (ref 0.1–1.0)
Monocytes Relative: 7 %
Neutro Abs: 4.4 10*3/uL (ref 1.7–7.7)
Neutrophils Relative %: 59 %
PLATELETS: 254 10*3/uL (ref 150–400)
RBC: 3.07 MIL/uL — AB (ref 4.22–5.81)
RDW: 13.4 % (ref 11.5–15.5)
WBC: 7.4 10*3/uL (ref 4.0–10.5)

## 2017-06-24 LAB — RENAL FUNCTION PANEL
ANION GAP: 10 (ref 5–15)
Albumin: 2.5 g/dL — ABNORMAL LOW (ref 3.5–5.0)
BUN: 64 mg/dL — ABNORMAL HIGH (ref 6–20)
CHLORIDE: 106 mmol/L (ref 101–111)
CO2: 25 mmol/L (ref 22–32)
Calcium: 8.5 mg/dL — ABNORMAL LOW (ref 8.9–10.3)
Creatinine, Ser: 9.51 mg/dL — ABNORMAL HIGH (ref 0.61–1.24)
GFR calc non Af Amer: 5 mL/min — ABNORMAL LOW (ref >60)
GFR, EST AFRICAN AMERICAN: 6 mL/min — AB (ref >60)
Glucose, Bld: 93 mg/dL (ref 65–99)
Phosphorus: 6.2 mg/dL — ABNORMAL HIGH (ref 2.5–4.6)
Potassium: 4.3 mmol/L (ref 3.5–5.1)
Sodium: 141 mmol/L (ref 135–145)

## 2017-06-24 NOTE — Progress Notes (Signed)
  [facility]  ROUNDING NOTE   Subjective:  Patient due for hemodialysis today. Sitting up in a chair at the moment. In good spirits.   Objective:  Vital signs in last 24 hours:  Temps 98 pulse 82 respirations 13 blood pressure 119/60  Physical Exam: General: No acute distress  Head: Normocephalic, atraumatic. Moist oral mucosal membranes  Eyes: Anicteric  Neck: Supple, trachea midline  Lungs:  Clear to auscultation, normal effort  Heart: S1S2 no rubs  Abdomen:  Soft, nontender, bowel sounds present  Extremities: Trace peripheral edema.  Neurologic: Awake, confused, follows simple commands  Skin: No lesions  Access: L IJ permcath in place    Basic Metabolic Panel:  Recent Labs Lab 06/18/17 0517 06/19/17 0016 06/21/17 1843 06/22/17 0603 06/24/17 0603  NA 136 134* 139 140 141  K 3.7 3.9 4.3 4.3 4.3  CL 103 102 102 102 106  CO2 22 26 24 23 25   GLUCOSE 107* 120* 143* 119* 93  BUN 29* 45* 51* 61* 64*  CREATININE 4.96* 7.00* 8.23* 8.77* 9.51*  CALCIUM 8.2* 8.3* 8.3* 8.4* 8.5*  PHOS 4.5 5.1* 6.2* 7.0* 6.2*    Liver Function Tests:  Recent Labs Lab 06/18/17 0517 06/19/17 0016 06/21/17 1843 06/22/17 0603 06/24/17 0603  ALBUMIN 2.4* 2.4* 2.4* 2.4* 2.5*   No results for input(s): LIPASE, AMYLASE in the last 168 hours. No results for input(s): AMMONIA in the last 168 hours.  CBC:  Recent Labs Lab 06/18/17 0517 06/19/17 0016 06/20/17 0721 06/21/17 1843 06/22/17 0603 06/24/17 0603  WBC 7.4 7.8 8.5 7.6 7.6 7.4  NEUTROABS 5.0  --  5.6  --  4.3 4.4  HGB 9.8* 9.4* 9.8* 9.0* 8.9* 8.7*  HCT 31.5* 30.2* 31.9* 29.6* 29.7* 28.7*  MCV 91.8 93.2 93.0 94.6 94.3 93.5  PLT 228 295 261 259 249 254    Cardiac Enzymes:  Recent Labs Lab 06/20/17 0125  TROPONINI <0.03    BNP: Invalid input(s): POCBNP  CBG: No results for input(s): GLUCAP in the last 168 hours.  Microbiology: No results found for this or any previous visit.  Coagulation  Studies: No results for input(s): LABPROT, INR in the last 72 hours.  Urinalysis: No results for input(s): COLORURINE, LABSPEC, PHURINE, GLUCOSEU, HGBUR, BILIRUBINUR, KETONESUR, PROTEINUR, UROBILINOGEN, NITRITE, LEUKOCYTESUR in the last 72 hours.  Invalid input(s): APPERANCEUR    Imaging: No results found.   Medications:       Assessment/ Plan:  71 y.o. male with a PMHx of obstructive sleep apnea, migraine headaches, coronary artery disease status post four-vessel CABG, hypertension, peripheral neuropathy, BPH, diabetes mellitus type 2, colon cancer, ESRD on HD (previously on PD), anemia of CKD, SHPTH.    1. ESRD on HD. Patient was transitioned from peritoneal dialysis to hemodialysis recently.  -  Patient due for hemodialysis today. Orders have been prepared. Dialysis will be on Monday.  2. Anemia of chronic kidney disease. Hemoglobin down to 8.7. Patient started on Aranesp.  3. Secondary hyperparathyroidism. Phosphorus down to 6.2 today. Continue Renvela. Follow-up serum phosphorus on Monday.  4. Hypertension.  Continue amlodipine and metoprolol.   LOS: 0 Takako Minckler 10/26/20188:41 AM

## 2017-06-26 LAB — CBC WITH DIFFERENTIAL/PLATELET
BASOS PCT: 1 %
Basophils Absolute: 0.1 10*3/uL (ref 0.0–0.1)
EOS PCT: 6 %
Eosinophils Absolute: 0.5 10*3/uL (ref 0.0–0.7)
HCT: 28.4 % — ABNORMAL LOW (ref 39.0–52.0)
HEMOGLOBIN: 8.8 g/dL — AB (ref 13.0–17.0)
LYMPHS PCT: 28 %
Lymphs Abs: 2.1 10*3/uL (ref 0.7–4.0)
MCH: 28.9 pg (ref 26.0–34.0)
MCHC: 31 g/dL (ref 30.0–36.0)
MCV: 93.4 fL (ref 78.0–100.0)
Monocytes Absolute: 0.6 10*3/uL (ref 0.1–1.0)
Monocytes Relative: 8 %
NEUTROS PCT: 57 %
Neutro Abs: 4.3 10*3/uL (ref 1.7–7.7)
Platelets: 218 10*3/uL (ref 150–400)
RBC: 3.04 MIL/uL — ABNORMAL LOW (ref 4.22–5.81)
RDW: 13.4 % (ref 11.5–15.5)
WBC: 7.6 10*3/uL (ref 4.0–10.5)

## 2017-06-27 LAB — CBC
HEMATOCRIT: 28.4 % — AB (ref 39.0–52.0)
HEMOGLOBIN: 9 g/dL — AB (ref 13.0–17.0)
MCH: 29.1 pg (ref 26.0–34.0)
MCHC: 31.7 g/dL (ref 30.0–36.0)
MCV: 91.9 fL (ref 78.0–100.0)
Platelets: 297 10*3/uL (ref 150–400)
RBC: 3.09 MIL/uL — AB (ref 4.22–5.81)
RDW: 15.9 % — ABNORMAL HIGH (ref 11.5–15.5)
WBC: 8 10*3/uL (ref 4.0–10.5)

## 2017-06-27 LAB — RENAL FUNCTION PANEL
ANION GAP: 13 (ref 5–15)
Albumin: 2.6 g/dL — ABNORMAL LOW (ref 3.5–5.0)
BUN: 43 mg/dL — ABNORMAL HIGH (ref 6–20)
CHLORIDE: 105 mmol/L (ref 101–111)
CO2: 23 mmol/L (ref 22–32)
CREATININE: 8.1 mg/dL — AB (ref 0.61–1.24)
Calcium: 8.6 mg/dL — ABNORMAL LOW (ref 8.9–10.3)
GFR, EST AFRICAN AMERICAN: 7 mL/min — AB (ref >60)
GFR, EST NON AFRICAN AMERICAN: 6 mL/min — AB (ref >60)
Glucose, Bld: 92 mg/dL (ref 65–99)
POTASSIUM: 4.3 mmol/L (ref 3.5–5.1)
Phosphorus: 5.2 mg/dL — ABNORMAL HIGH (ref 2.5–4.6)
Sodium: 141 mmol/L (ref 135–145)

## 2017-06-27 NOTE — Progress Notes (Signed)
  [facility]  ROUNDING NOTE   Subjective:  Patient sitting up in chair this afternoon. Feeling well today. Working with physical therapy.   Objective:  Vital signs in last 24 hours:  Temperature 97.6 pulse 66 respirations 16 blood pressure 117/65  Physical Exam: General: No acute distress  Head: Normocephalic, atraumatic. Moist oral mucosal membranes  Eyes: Anicteric  Neck: Supple, trachea midline  Lungs:  Clear to auscultation, normal effort  Heart: S1S2 no rubs  Abdomen:  Soft, nontender, bowel sounds present  Extremities: Trace peripheral edema.  Neurologic: Awake, alert, follows simple commands  Skin: No lesions  Access: L IJ permcath in place    Basic Metabolic Panel:  Recent Labs Lab 06/21/17 1843 06/22/17 0603 06/24/17 0603 06/27/17 0716  NA 139 140 141 141  K 4.3 4.3 4.3 4.3  CL 102 102 106 105  CO2 24 23 25 23   GLUCOSE 143* 119* 93 92  BUN 51* 61* 64* 43*  CREATININE 8.23* 8.77* 9.51* 8.10*  CALCIUM 8.3* 8.4* 8.5* 8.6*  PHOS 6.2* 7.0* 6.2* 5.2*    Liver Function Tests:  Recent Labs Lab 06/21/17 1843 06/22/17 0603 06/24/17 0603 06/27/17 0716  ALBUMIN 2.4* 2.4* 2.5* 2.6*   No results for input(s): LIPASE, AMYLASE in the last 168 hours. No results for input(s): AMMONIA in the last 168 hours.  CBC:  Recent Labs Lab 06/21/17 1843 06/22/17 0603 06/24/17 0603 06/26/17 0515 06/27/17 0716  WBC 7.6 7.6 7.4 7.6 8.0  NEUTROABS  --  4.3 4.4 4.3  --   HGB 9.0* 8.9* 8.7* 8.8* 9.0*  HCT 29.6* 29.7* 28.7* 28.4* 28.4*  MCV 94.6 94.3 93.5 93.4 91.9  PLT 259 249 254 218 297    Cardiac Enzymes: No results for input(s): CKTOTAL, CKMB, CKMBINDEX, TROPONINI in the last 168 hours.  BNP: Invalid input(s): POCBNP  CBG: No results for input(s): GLUCAP in the last 168 hours.  Microbiology: No results found for this or any previous visit.  Coagulation Studies: No results for input(s): LABPROT, INR in the last 72  hours.  Urinalysis: No results for input(s): COLORURINE, LABSPEC, PHURINE, GLUCOSEU, HGBUR, BILIRUBINUR, KETONESUR, PROTEINUR, UROBILINOGEN, NITRITE, LEUKOCYTESUR in the last 72 hours.  Invalid input(s): APPERANCEUR    Imaging: No results found.   Medications:       Assessment/ Plan:  71 y.o. male with a PMHx of obstructive sleep apnea, migraine headaches, coronary artery disease status post four-vessel CABG, hypertension, peripheral neuropathy, BPH, diabetes mellitus type 2, colon cancer, ESRD on HD (previously on PD), anemia of CKD, SHPTH.    1. ESRD on HD. Patient was transitioned from peritoneal dialysis to hemodialysis recently.  -  Patient doing well at the moment. We will plan for dialysis again later today.  2. Anemia of chronic kidney disease. Hemoglobin up to 9.0. Maintain the patient on Aranesp.  3. Secondary hyperparathyroidism. Phosphorus down to 5.2. Maintain the patient on Renvela.  4. Hypertension.  Continue amlodipine and metoprolol.   LOS: 0 Adamaris King 10/29/20182:17 PM

## 2017-06-28 LAB — CBC WITH DIFFERENTIAL/PLATELET
BASOS ABS: 0.1 10*3/uL (ref 0.0–0.1)
Basophils Relative: 1 %
EOS ABS: 0.6 10*3/uL (ref 0.0–0.7)
Eosinophils Relative: 7 %
HCT: 28.6 % — ABNORMAL LOW (ref 39.0–52.0)
HEMOGLOBIN: 8.8 g/dL — AB (ref 13.0–17.0)
LYMPHS ABS: 1.8 10*3/uL (ref 0.7–4.0)
Lymphocytes Relative: 21 %
MCH: 28.9 pg (ref 26.0–34.0)
MCHC: 30.8 g/dL (ref 30.0–36.0)
MCV: 94.1 fL (ref 78.0–100.0)
Monocytes Absolute: 0.7 10*3/uL (ref 0.1–1.0)
Monocytes Relative: 8 %
NEUTROS PCT: 63 %
Neutro Abs: 5.4 10*3/uL (ref 1.7–7.7)
Platelets: 174 10*3/uL (ref 150–400)
RBC: 3.04 MIL/uL — AB (ref 4.22–5.81)
RDW: 13.3 % (ref 11.5–15.5)
WBC: 8.5 10*3/uL (ref 4.0–10.5)

## 2018-04-04 IMAGING — CT CT HEAD W/O CM
3 of 4 series · 13 of 47 positions shown, 15 images · non-contrast
Comparison: None.

CLINICAL DATA: Altered mental status

EXAM:
CT HEAD WITHOUT CONTRAST
TECHNIQUE: Contiguous axial images were obtained from the base of the skull
through the vertex without intravenous contrast.

[Series 3: head without · axial · non-contrast · 0.47mm/px · z∈[-47,+88]mm · 7 of 37 slices shown, 9 images]
[im 5/37  brain]
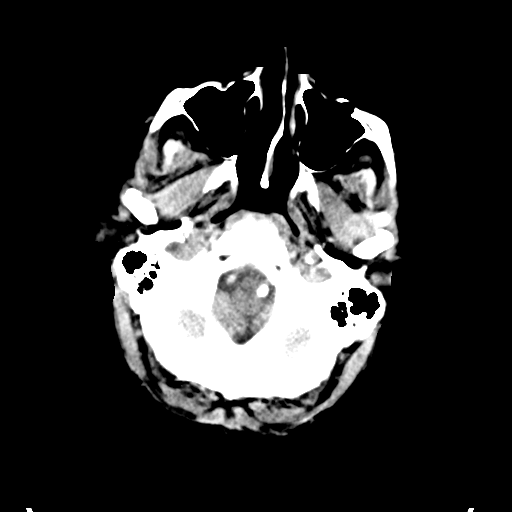
[im 5/37  bone]
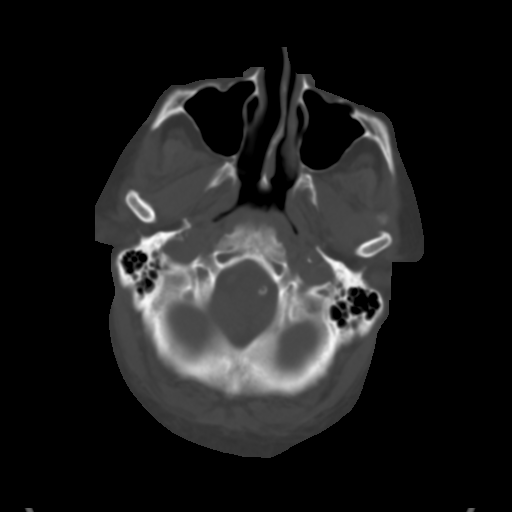
[im 10/37  brain]
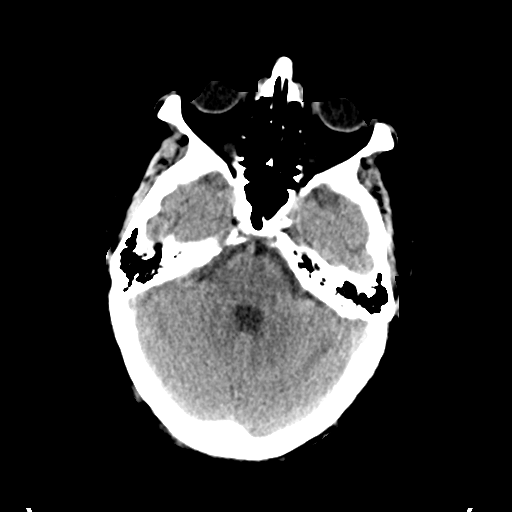
[im 14/37  brain]
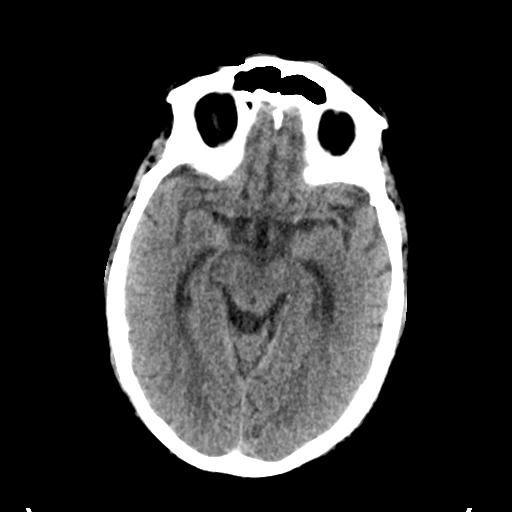
[im 19/37  brain]
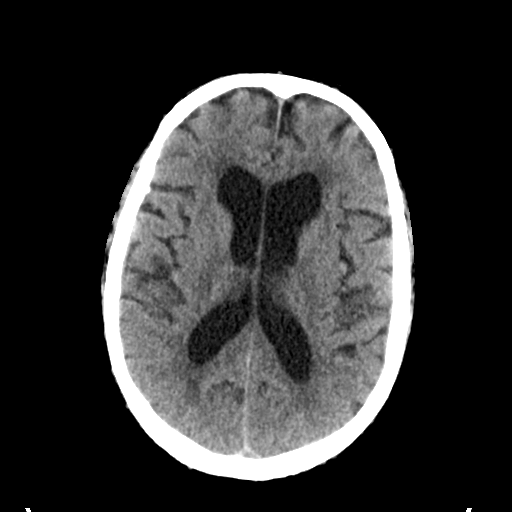
[im 23/37  brain]
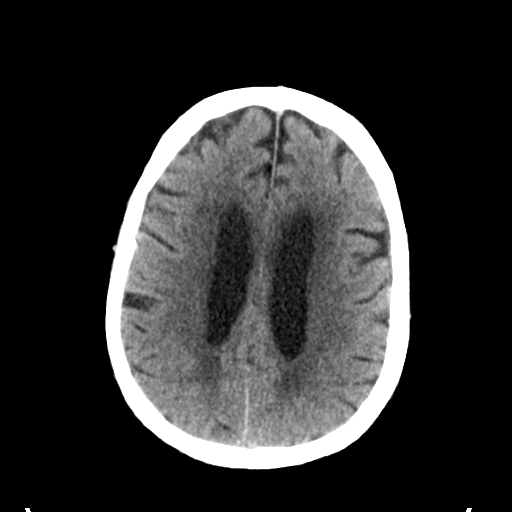
[im 23/37  bone]
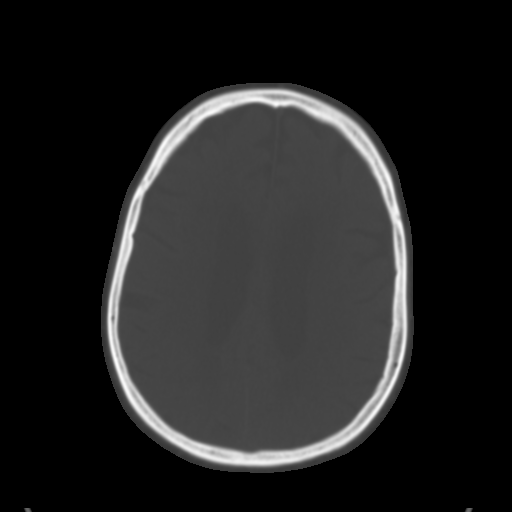
[im 28/37  brain]
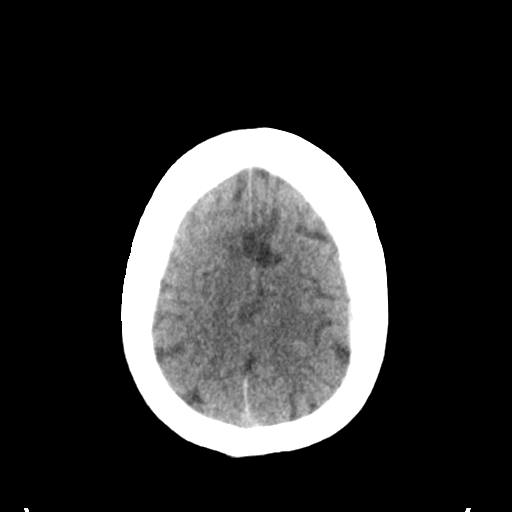
[im 32/37  brain]
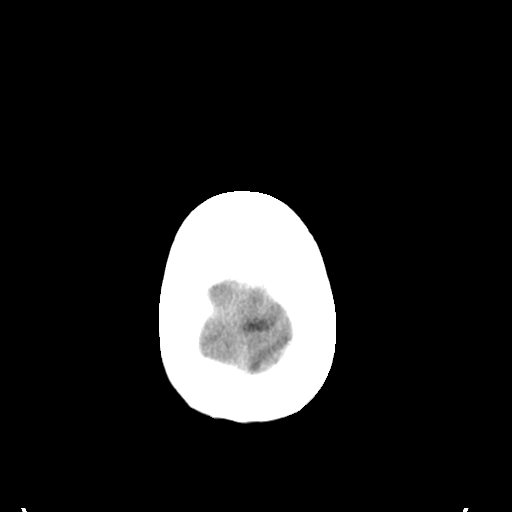

[Series 5: head without cor · coronal · non-contrast · 0.35mm/px · 3 of 74 slices shown]
[im 25/74  brain]
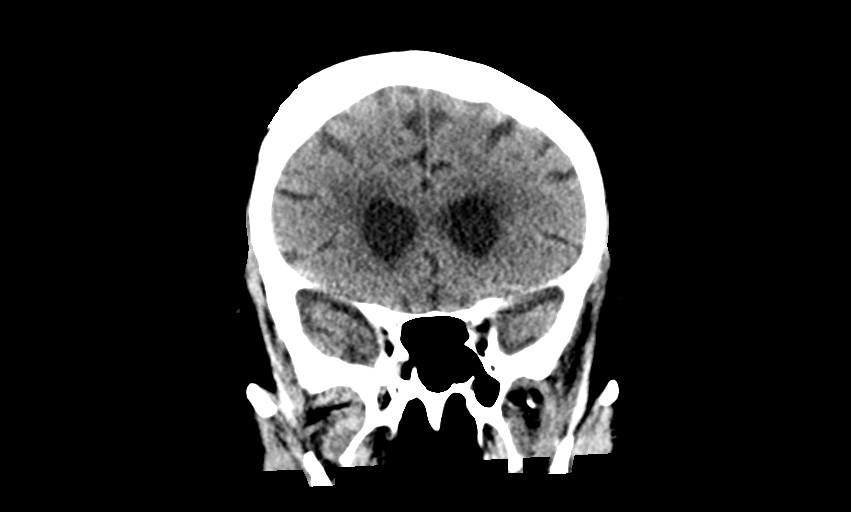
[im 33/74  brain]
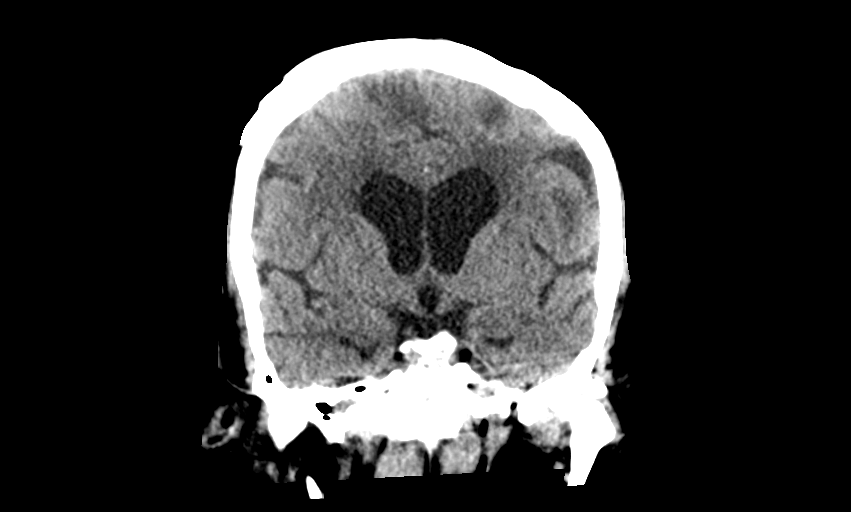
[im 41/74  brain]
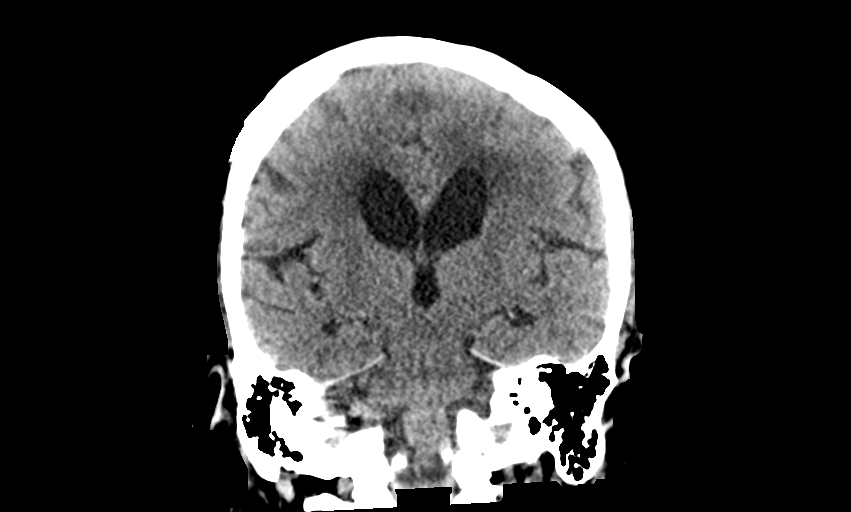

[Series 6: head without sag · sagittal · non-contrast · 0.35mm/px · 3 of 67 slices shown]
[im 23/67  brain]
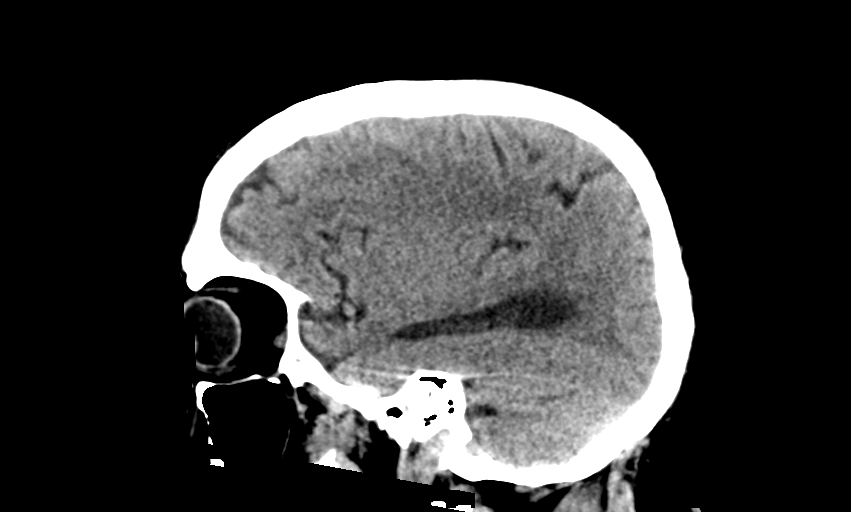
[im 34/67  brain]
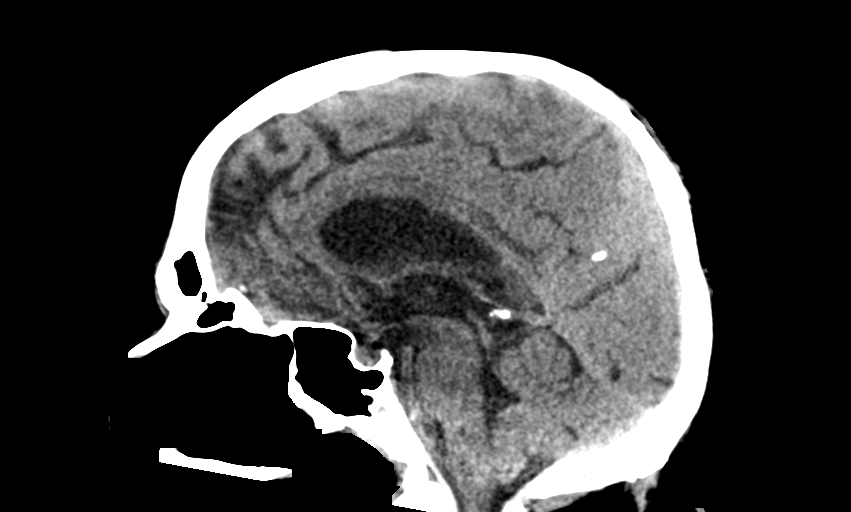
[im 45/67  brain]
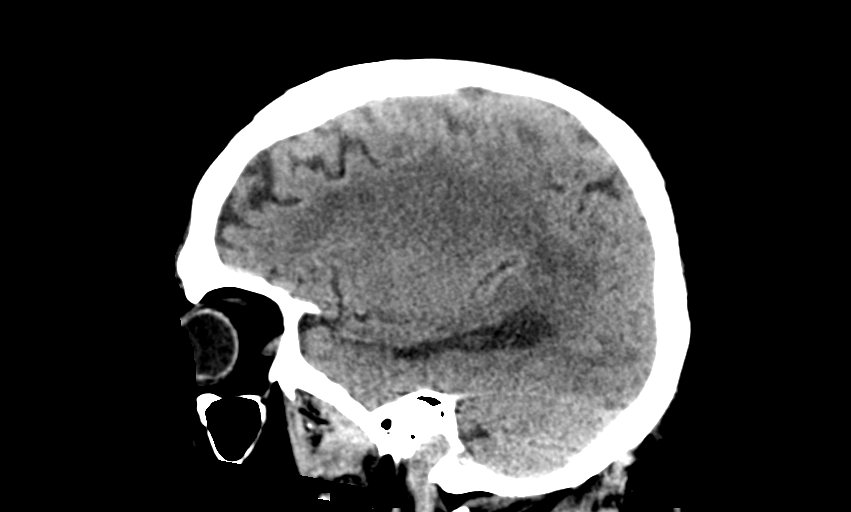

[13 of 47 positions shown; findings below may reference images not displayed]

FINDINGS: Motion degraded images.

Brain: No evidence of acute infarction, hemorrhage, hydrocephalus,
extra-axial collection or mass lesion/mass effect.

Global cortical and central atrophy.

Subcortical white matter and periventricular small vessel ischemic
changes.

Vascular: Intracranial atherosclerosis.

Skull: Normal. Negative for fracture or focal lesion.

Sinuses/Orbits: The visualized paranasal sinuses are essentially
clear. The mastoid air cells are unopacified.

Other: None.
IMPRESSION: Motion degraded images.

No evidence of acute intracranial abnormality.

Atrophy with small vessel ischemic changes.

## 2018-04-17 IMAGING — CR DG CHEST 1V PORT
1 series · 1 of 1 positions shown · non-contrast
Comparison: None.

CLINICAL DATA: HD Cat Placement

EXAM:
PORTABLE CHEST 1 VIEW

[AP]
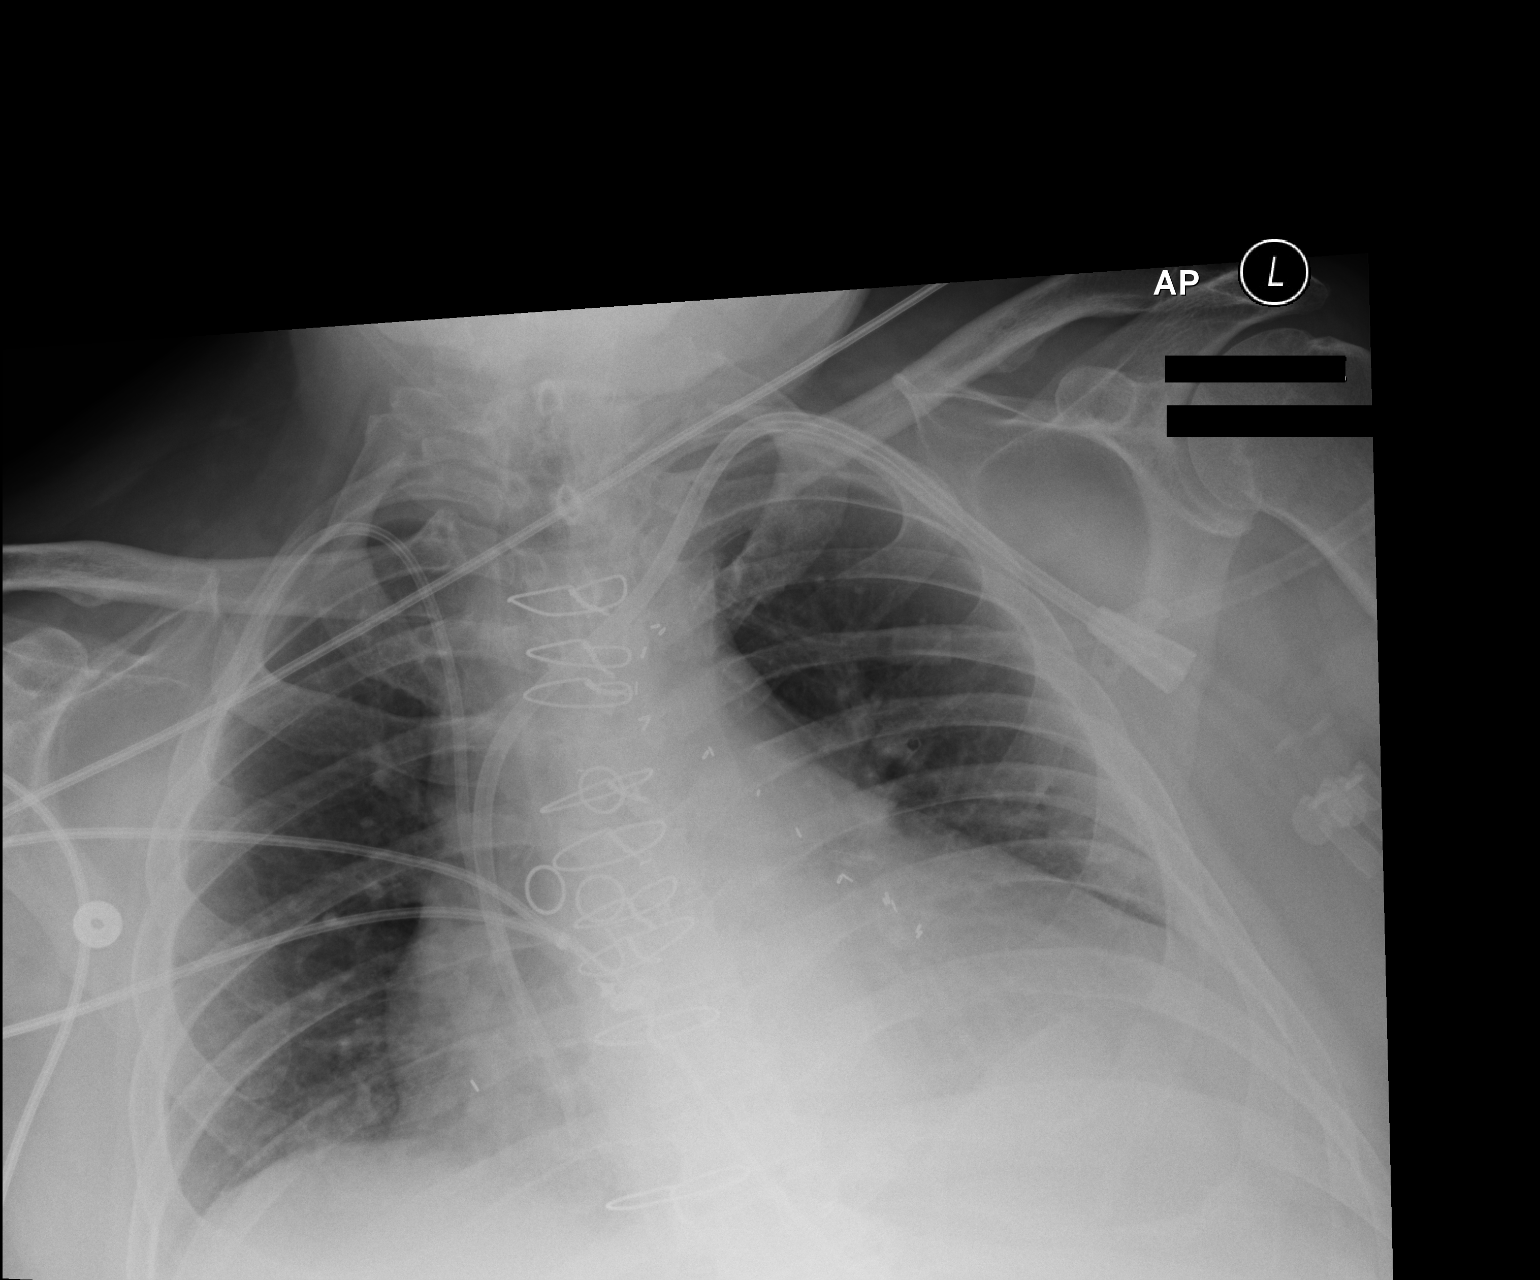

[1 of 1 positions shown; findings below may reference images not displayed]

FINDINGS: LEFT central venous line with split tips in the RIGHT atrium. RIGHT
Center venous line with tip in distal SVC. Cardiac silhouette is
enlarged. No pulmonary edema. No pneumothorax .
IMPRESSION: Two central venous lines in place. No pneumothorax. No pulmonary
edema.

## 2019-05-31 DEATH — deceased
